# Patient Record
Sex: Male | Born: 1975 | Hispanic: Yes | Marital: Single
Health system: Southern US, Community
[De-identification: ages and names within clinical notes are randomized; demographics above are authoritative.]

---

## 2008-08-11 ENCOUNTER — Emergency Department (HOSPITAL_BASED_OUTPATIENT_CLINIC_OR_DEPARTMENT_OTHER): Admission: EM | Admit: 2008-08-11 | Discharge: 2008-08-11 | Payer: Self-pay | Admitting: Emergency Medicine

## 2018-01-13 ENCOUNTER — Encounter (HOSPITAL_COMMUNITY): Payer: Self-pay | Admitting: Neurology

## 2018-01-13 ENCOUNTER — Emergency Department (HOSPITAL_COMMUNITY): Payer: Self-pay

## 2018-01-13 ENCOUNTER — Emergency Department (HOSPITAL_COMMUNITY)
Admission: EM | Admit: 2018-01-13 | Discharge: 2018-01-13 | Disposition: A | Discharge status: Home / Self Care | Payer: Self-pay | Attending: Emergency Medicine | Admitting: Emergency Medicine

## 2018-01-13 ENCOUNTER — Other Ambulatory Visit: Payer: Self-pay

## 2018-01-13 DIAGNOSIS — R51 Headache: Secondary | ICD-10-CM

## 2018-01-13 DIAGNOSIS — G43809 Other migraine, not intractable, without status migrainosus: Secondary | ICD-10-CM | POA: Insufficient documentation

## 2018-01-13 DIAGNOSIS — R200 Anesthesia of skin: Secondary | ICD-10-CM | POA: Insufficient documentation

## 2018-01-13 DIAGNOSIS — R4781 Slurred speech: Secondary | ICD-10-CM | POA: Insufficient documentation

## 2018-01-13 DIAGNOSIS — G43109 Migraine with aura, not intractable, without status migrainosus: Secondary | ICD-10-CM

## 2018-01-13 DIAGNOSIS — F8081 Childhood onset fluency disorder: Secondary | ICD-10-CM

## 2018-01-13 LAB — I-STAT CHEM 8, ED
BUN: 13 mg/dL (ref 6–20)
CHLORIDE: 103 mmol/L (ref 98–111)
Calcium, Ion: 1.09 mmol/L — ABNORMAL LOW (ref 1.15–1.40)
Creatinine, Ser: 0.8 mg/dL (ref 0.61–1.24)
GLUCOSE: 106 mg/dL — AB (ref 70–99)
HCT: 42 % (ref 39.0–52.0)
Hemoglobin: 14.3 g/dL (ref 13.0–17.0)
Potassium: 3.6 mmol/L (ref 3.5–5.1)
Sodium: 137 mmol/L (ref 135–145)
TCO2: 24 mmol/L (ref 22–32)

## 2018-01-13 LAB — COMPREHENSIVE METABOLIC PANEL
ALBUMIN: 4.5 g/dL (ref 3.5–5.0)
ALK PHOS: 55 U/L (ref 38–126)
ALT: 51 U/L — AB (ref 0–44)
AST: 34 U/L (ref 15–41)
Anion gap: 12 (ref 5–15)
BILIRUBIN TOTAL: 1.1 mg/dL (ref 0.3–1.2)
BUN: 11 mg/dL (ref 6–20)
CALCIUM: 9.4 mg/dL (ref 8.9–10.3)
CO2: 24 mmol/L (ref 22–32)
CREATININE: 0.94 mg/dL (ref 0.61–1.24)
Chloride: 100 mmol/L (ref 98–111)
GFR calc non Af Amer: >60 mL/min (ref >60)
GLUCOSE: 108 mg/dL — AB (ref 70–99)
Potassium: 3.5 mmol/L (ref 3.5–5.1)
Sodium: 136 mmol/L (ref 135–145)
TOTAL PROTEIN: 8 g/dL (ref 6.5–8.1)

## 2018-01-13 LAB — CBC
HCT: 41.2 % (ref 39.0–52.0)
Hemoglobin: 14.2 g/dL (ref 13.0–17.0)
MCH: 30.4 pg (ref 26.0–34.0)
MCHC: 34.5 g/dL (ref 30.0–36.0)
MCV: 88.2 fL (ref 78.0–100.0)
PLATELETS: 252 10*3/uL (ref 150–400)
RBC: 4.67 MIL/uL (ref 4.22–5.81)
RDW: 12.5 % (ref 11.5–15.5)
WBC: 12.1 10*3/uL — AB (ref 4.0–10.5)

## 2018-01-13 LAB — DIFFERENTIAL
Abs Immature Granulocytes: 0.1 10*3/uL (ref 0.0–0.1)
Basophils Absolute: 0 10*3/uL (ref 0.0–0.1)
Basophils Relative: 0 %
EOS PCT: 1 %
Eosinophils Absolute: 0.1 10*3/uL (ref 0.0–0.7)
Immature Granulocytes: 0 %
LYMPHS ABS: 2.2 10*3/uL (ref 0.7–4.0)
LYMPHS PCT: 19 %
MONO ABS: 0.6 10*3/uL (ref 0.1–1.0)
Monocytes Relative: 5 %
NEUTROS ABS: 9.2 10*3/uL — AB (ref 1.7–7.7)
Neutrophils Relative %: 75 %

## 2018-01-13 LAB — APTT: APTT: 25 s (ref 24–36)

## 2018-01-13 LAB — I-STAT TROPONIN, ED: Troponin i, poc: 0 ng/mL (ref 0.00–0.08)

## 2018-01-13 LAB — PROTIME-INR
INR: 0.93
PROTHROMBIN TIME: 12.4 s (ref 11.4–15.2)

## 2018-01-13 LAB — CBG MONITORING, ED: Glucose-Capillary: 102 mg/dL — ABNORMAL HIGH (ref 70–99)

## 2018-01-13 MED ORDER — KETOROLAC TROMETHAMINE 30 MG/ML IJ SOLN
30.0000 mg | Freq: Once | INTRAMUSCULAR | Status: AC
  Administered 2018-01-13: 30 mg via INTRAVENOUS
  Filled 2018-01-13: qty 1

## 2018-01-13 MED ORDER — LORAZEPAM 2 MG/ML IJ SOLN: 1.0000 mg | Freq: Once | INTRAMUSCULAR | Status: DC

## 2018-01-13 MED ORDER — SODIUM CHLORIDE 0.9 % IV BOLUS
1000.0000 mL | Freq: Once | INTRAVENOUS | Status: AC
  Administered 2018-01-13: 1000 mL via INTRAVENOUS

## 2018-01-13 MED ORDER — PROCHLORPERAZINE EDISYLATE 10 MG/2ML IJ SOLN
10.0000 mg | Freq: Once | INTRAMUSCULAR | Status: AC
  Administered 2018-01-13: 10 mg via INTRAVENOUS
  Filled 2018-01-13: qty 2

## 2018-01-13 MED ORDER — DIPHENHYDRAMINE HCL 50 MG/ML IJ SOLN
25.0000 mg | Freq: Once | INTRAMUSCULAR | Status: AC
  Administered 2018-01-13: 25 mg via INTRAVENOUS
  Filled 2018-01-13: qty 1
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
## 2018-01-13 NOTE — Discharge Instructions (Signed)
Work-up today is very reassuring, CT of the head and MRI show no evidence of stroke or other acute problem in the brain causing her symptoms.  This was likely a stress response due to the stressful situation you had at home today versus a complicated migraine.  Your labs look good as well, you have a slightly elevated white blood cell count this is very common in the setting of stress, please have this rechecked with your primary care doctor in a week.  Return to the emergency department if you have a return of numbness, difficulty speaking, facial droop or any other new or concerning symptoms.
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
